# Patient Record
Sex: Male | Born: 2005 | Hispanic: Yes | Marital: Single
Health system: Southern US, Community
[De-identification: ages and names within clinical notes are randomized; demographics above are authoritative.]

---
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
## 2024-01-14 DIAGNOSIS — Z419 Encounter for procedure for purposes other than remedying health state, unspecified: Secondary | ICD-10-CM | POA: Diagnosis not present

## 2024-02-11 DIAGNOSIS — Z419 Encounter for procedure for purposes other than remedying health state, unspecified: Secondary | ICD-10-CM | POA: Diagnosis not present

## 2024-03-24 DIAGNOSIS — Z419 Encounter for procedure for purposes other than remedying health state, unspecified: Secondary | ICD-10-CM | POA: Diagnosis not present

## 2024-04-23 DIAGNOSIS — Z419 Encounter for procedure for purposes other than remedying health state, unspecified: Secondary | ICD-10-CM | POA: Diagnosis not present

## 2024-05-24 DIAGNOSIS — Z419 Encounter for procedure for purposes other than remedying health state, unspecified: Secondary | ICD-10-CM | POA: Diagnosis not present

## 2024-06-23 DIAGNOSIS — Z419 Encounter for procedure for purposes other than remedying health state, unspecified: Secondary | ICD-10-CM | POA: Diagnosis not present

## 2024-07-24 DIAGNOSIS — Z419 Encounter for procedure for purposes other than remedying health state, unspecified: Secondary | ICD-10-CM | POA: Diagnosis not present

## 2024-08-24 DIAGNOSIS — Z419 Encounter for procedure for purposes other than remedying health state, unspecified: Secondary | ICD-10-CM | POA: Diagnosis not present
